# Patient Record
Sex: Female | Born: 1985 | Hispanic: Yes | Marital: Married | Smoking: Never smoker
Health system: Southern US, Community
[De-identification: ages and names within clinical notes are randomized; demographics above are authoritative.]

## PROBLEM LIST (undated history)

## (undated) DIAGNOSIS — N390 Urinary tract infection, site not specified: Secondary | ICD-10-CM

## (undated) DIAGNOSIS — N83209 Unspecified ovarian cyst, unspecified side: Secondary | ICD-10-CM

## (undated) DIAGNOSIS — N200 Calculus of kidney: Secondary | ICD-10-CM

## (undated) HISTORY — DX: Unspecified ovarian cyst, unspecified side: N83.209

## (undated) HISTORY — DX: Calculus of kidney: N20.0

## (undated) HISTORY — DX: Urinary tract infection, site not specified: N39.0

## (undated) HISTORY — PX: NO PAST SURGERIES: SHX2092

---

## 2021-02-27 LAB — OB RESULTS CONSOLE HIV ANTIBODY (ROUTINE TESTING): HIV: NONREACTIVE

## 2021-02-27 LAB — OB RESULTS CONSOLE ABO/RH: RH Type: POSITIVE

## 2021-02-27 LAB — OB RESULTS CONSOLE GC/CHLAMYDIA
Chlamydia: NEGATIVE
Gonorrhea: NEGATIVE

## 2021-02-27 LAB — OB RESULTS CONSOLE RUBELLA ANTIBODY, IGM: Rubella: IMMUNE

## 2021-02-27 LAB — OB RESULTS CONSOLE HEPATITIS B SURFACE ANTIGEN: Hepatitis B Surface Ag: NEGATIVE

## 2021-02-27 LAB — OB RESULTS CONSOLE RPR: RPR: NONREACTIVE

## 2021-02-27 LAB — OB RESULTS CONSOLE ANTIBODY SCREEN: Antibody Screen: NEGATIVE

## 2021-03-06 ENCOUNTER — Other Ambulatory Visit: Payer: Self-pay | Admitting: Family

## 2021-03-06 DIAGNOSIS — Z363 Encounter for antenatal screening for malformations: Secondary | ICD-10-CM

## 2021-03-06 DIAGNOSIS — O09522 Supervision of elderly multigravida, second trimester: Secondary | ICD-10-CM

## 2021-03-06 DIAGNOSIS — Z3A19 19 weeks gestation of pregnancy: Secondary | ICD-10-CM

## 2021-03-15 ENCOUNTER — Other Ambulatory Visit: Payer: Self-pay

## 2021-03-15 ENCOUNTER — Other Ambulatory Visit: Payer: Self-pay | Admitting: Obstetrics and Gynecology

## 2021-03-15 ENCOUNTER — Ambulatory Visit
Admission: RE | Admit: 2021-03-15 | Discharge: 2021-03-15 | Disposition: A | Discharge status: Home / Self Care | Payer: No Typology Code available for payment source | Source: Ambulatory Visit | Attending: Obstetrics and Gynecology | Admitting: Obstetrics and Gynecology

## 2021-03-15 DIAGNOSIS — Z111 Encounter for screening for respiratory tuberculosis: Secondary | ICD-10-CM

## 2021-03-28 ENCOUNTER — Encounter: Payer: Self-pay | Admitting: *Deleted

## 2021-03-30 ENCOUNTER — Other Ambulatory Visit: Payer: Self-pay | Admitting: *Deleted

## 2021-03-30 ENCOUNTER — Encounter: Payer: Self-pay | Admitting: *Deleted

## 2021-03-30 ENCOUNTER — Ambulatory Visit (HOSPITAL_BASED_OUTPATIENT_CLINIC_OR_DEPARTMENT_OTHER): Payer: Self-pay | Admitting: Genetic Counselor

## 2021-03-30 ENCOUNTER — Ambulatory Visit: Payer: Self-pay | Attending: Family

## 2021-03-30 ENCOUNTER — Ambulatory Visit: Payer: Self-pay | Admitting: Genetic Counselor

## 2021-03-30 ENCOUNTER — Other Ambulatory Visit: Payer: Self-pay

## 2021-03-30 ENCOUNTER — Ambulatory Visit: Payer: Self-pay | Admitting: *Deleted

## 2021-03-30 VITALS — BP 114/70 | HR 101 | Ht 63.0 in

## 2021-03-30 DIAGNOSIS — Z315 Encounter for genetic counseling: Secondary | ICD-10-CM | POA: Insufficient documentation

## 2021-03-30 DIAGNOSIS — Z3A19 19 weeks gestation of pregnancy: Secondary | ICD-10-CM | POA: Insufficient documentation

## 2021-03-30 DIAGNOSIS — Z363 Encounter for antenatal screening for malformations: Secondary | ICD-10-CM | POA: Insufficient documentation

## 2021-03-30 DIAGNOSIS — O09522 Supervision of elderly multigravida, second trimester: Secondary | ICD-10-CM

## 2021-03-30 DIAGNOSIS — O09529 Supervision of elderly multigravida, unspecified trimester: Secondary | ICD-10-CM

## 2021-03-30 NOTE — Progress Notes (Addendum)
ADDENDUM 04/14/21: I requested a copy of Linda Barker's quad screen results on 03/30/21 following her genetic counseling appointment. I received a fax from Linus Orn, RN at the [facility] Department informing me that the patient's quad screen results were not available. The [facility] Department will contact the patient once her quad screen results become available.  ----------------------------------------------------------------------------------------------------------------------  03/30/2021  Linda Barker 12-22-1986 MRN: [id number] DOV: 03/30/2021  [name] presented to the [facility] for a genetics consultation regarding advanced maternal age. [name] presented to her appointment alone. This session was facilitated by [facility] Spanish interpreter, Linda Barker.   Indication for genetic counseling - Advanced maternal age  Prenatal history  [name] is a [id number], 35 y.o. female. Her current pregnancy has completed [redacted]w[redacted]d (Estimated Date of Delivery: 08/01/21). [name] has a 48 year old son from a prior relationship. She and her current partner have a 67 year old daughter, a 47 year old daughter, and a 13 year old daughter together.  [name] denied exposure to environmental toxins or chemical agents. She denied the use of alcohol, tobacco or street drugs. She reported taking Tylenol. She denied significant viral illnesses, fevers, and bleeding during the course of her pregnancy. Her medical and surgical histories were noncontributory.  Family History  A three generation pedigree was drafted and reviewed. Both family histories were reviewed and found to be noncontributory for birth defects, intellectual disability, recurrent pregnancy loss, and known genetic conditions. [name] had limited information about her extended maternal relatives; thus, risk assessment  was limited.  The patient's ancestry is [place]. The father of the pregnancy's ancestry is [place]. Ashkenazi Jewish ancestry and consanguinity were denied. Pedigree will be scanned under Media.  Discussion  Advanced maternal age:  [name] was referred to genetic counseling for advanced maternal age, as she will be 35 years old at the time of delivery. We discussed that as a woman ages, the risk for certain chromosomal conditions, such as trisomy 62 (Down syndrome), trisomy 45, and trisomy 18 increases. These conditions often are not inherited, but instead occur due to an error in chromosomal division during the formation of sperm and egg cells in a process called nondisjunction. At her age and during the second trimester, [name] has approximately a 1 in 141 (0.7%) chance of having a child with a chromosomal aneuploidy. Her age-related risk to have a child with Down syndrome specifically is 1 in 310 (0.3%) in the second trimester. We briefly reviewed features associated with Down syndrome, trisomy 64, and trisomy 75.   [name] informed me that she had a sample drawn for quad screening on Monday at her OBGYN office. We discussed that quad screening measures four hormones in a pregnant woman's blood. The levels of these proteins can help identify if a pregnancy is at high risk for trisomy 36 (Down syndrome), trisomy 18, and open neural tube defects (ONTDs). Quad screening detects 75-80% of cases of Down syndrome, 73% of cases of trisomy 18, and 80-90% of ONTDs. Thus, it would not identify or rule out all fetal aneuploidy.   [name] was not sure if her results from quad screening are available yet. I will called the [facility] Department and request a copy of these results if they have been returned.  Ultrasound:  A complete ultrasound was performed today prior to our visit. The ultrasound report  will be sent under separate cover. There  were no visualized fetal anomalies or markers suggestive of aneuploidy. [name] was counseled that ~50% of fetuses with Down syndrome and 90-95% of fetuses with trisomy 14 or trisomy 27 demonstrate a sign of the respective conditions on anatomy ultrasound.  Diagnostic testing:  [name] was also counseled regarding diagnostic testing via amniocentesis. We discussed the technical aspects of the procedure and quoted up to a 1 in 500 (0.2%) risk for spontaneous pregnancy loss or other adverse pregnancy outcomes as a result of amniocentesis. Cultured cells from an amniocentesis sample allow for the visualization of a fetal karyotype, which can detect >99% of large chromosomal aberrations, including aneuploidies associated with advanced maternal age. Chromosomal microarray can also be performed to identify smaller deletions or duplications of fetal chromosomal material. [name] was counseled that amniocentesis is the only way to definitively determine if a fetus has a chromosomal aneuploidy prenatally. After careful consideration, [name] declined amniocentesis at this time. She understands that amniocentesis is available at any point after 16 weeks of pregnancy and that she may opt to undergo the procedure at a later date should she change her mind. She informed me that receiving a positive result on amniocentesis would not change how she manages her pregnancy and that she would prefer to pursue postnatal testing if indicated.  Plan:  [name] reportedly had a sample for quad screening early this week but was not sure if her results are back yet. We made a plan for me to call the [facility] Department to request a copy of these results if they are available. I will notify [name] of the results. If screening was not ordered and is desired, I can help facilitate sample collection and coordinate testing for [name].  I counseled [name] regarding the above risks and available options. The approximate face-to-face time with the genetic counselor was 30 minutes.  In summary:  Discussed advanced maternal age and options for follow-up testing  ~0.7% chance for fetus to have chromosomal aneuploidy  Sample drawn for quad screening with referring provider. I will contact them to request a copy of the results if available, then update the patient  Reviewed results of ultrasound  No fetal anomalies or markers seen  Reduction in risk for fetal aneuploidy  Offered additional testing and screening  Declined amniocentesis  Reviewed family history concerns   Gershon Crane, MS, Aeronautical engineer

## 2021-05-01 ENCOUNTER — Ambulatory Visit: Payer: Self-pay

## 2021-05-25 ENCOUNTER — Encounter: Payer: Self-pay | Admitting: *Deleted

## 2021-05-25 ENCOUNTER — Ambulatory Visit (HOSPITAL_BASED_OUTPATIENT_CLINIC_OR_DEPARTMENT_OTHER): Payer: Self-pay

## 2021-05-25 ENCOUNTER — Ambulatory Visit: Payer: Self-pay | Attending: Obstetrics | Admitting: *Deleted

## 2021-05-25 ENCOUNTER — Other Ambulatory Visit: Payer: Self-pay

## 2021-05-25 VITALS — BP 112/75 | HR 89

## 2021-05-25 DIAGNOSIS — O09293 Supervision of pregnancy with other poor reproductive or obstetric history, third trimester: Secondary | ICD-10-CM | POA: Insufficient documentation

## 2021-05-25 DIAGNOSIS — O0933 Supervision of pregnancy with insufficient antenatal care, third trimester: Secondary | ICD-10-CM | POA: Insufficient documentation

## 2021-05-25 DIAGNOSIS — Z3A30 30 weeks gestation of pregnancy: Secondary | ICD-10-CM | POA: Insufficient documentation

## 2021-05-25 DIAGNOSIS — O09523 Supervision of elderly multigravida, third trimester: Secondary | ICD-10-CM | POA: Insufficient documentation

## 2021-05-25 DIAGNOSIS — O09529 Supervision of elderly multigravida, unspecified trimester: Secondary | ICD-10-CM

## 2021-05-26 ENCOUNTER — Other Ambulatory Visit: Payer: Self-pay | Admitting: *Deleted

## 2021-05-26 ENCOUNTER — Telehealth: Payer: Self-pay

## 2021-05-26 DIAGNOSIS — O09523 Supervision of elderly multigravida, third trimester: Secondary | ICD-10-CM

## 2021-05-26 NOTE — Telephone Encounter (Signed)
Mailed patient her GFE with a est patient letter and map.  

## 2021-07-06 ENCOUNTER — Ambulatory Visit: Payer: Self-pay

## 2021-07-10 ENCOUNTER — Ambulatory Visit (HOSPITAL_BASED_OUTPATIENT_CLINIC_OR_DEPARTMENT_OTHER): Payer: Self-pay

## 2021-07-10 ENCOUNTER — Ambulatory Visit: Payer: Self-pay | Attending: Obstetrics and Gynecology | Admitting: *Deleted

## 2021-07-10 ENCOUNTER — Encounter: Payer: Self-pay | Admitting: *Deleted

## 2021-07-10 ENCOUNTER — Other Ambulatory Visit: Payer: Self-pay

## 2021-07-10 VITALS — BP 108/70 | HR 97

## 2021-07-10 DIAGNOSIS — O0933 Supervision of pregnancy with insufficient antenatal care, third trimester: Secondary | ICD-10-CM | POA: Insufficient documentation

## 2021-07-10 DIAGNOSIS — O09523 Supervision of elderly multigravida, third trimester: Secondary | ICD-10-CM | POA: Insufficient documentation

## 2021-07-10 DIAGNOSIS — O09293 Supervision of pregnancy with other poor reproductive or obstetric history, third trimester: Secondary | ICD-10-CM | POA: Insufficient documentation

## 2021-07-10 DIAGNOSIS — O3663X0 Maternal care for excessive fetal growth, third trimester, not applicable or unspecified: Secondary | ICD-10-CM | POA: Insufficient documentation

## 2021-07-10 DIAGNOSIS — Z3A36 36 weeks gestation of pregnancy: Secondary | ICD-10-CM | POA: Insufficient documentation

## 2021-07-10 NOTE — Progress Notes (Signed)
C/o"pain, burning in vagina-currently taking Valtrex."

## 2021-07-28 ENCOUNTER — Telehealth (HOSPITAL_COMMUNITY): Payer: Self-pay | Admitting: *Deleted

## 2021-07-28 ENCOUNTER — Other Ambulatory Visit: Payer: Self-pay | Admitting: Obstetrics & Gynecology

## 2021-07-28 ENCOUNTER — Encounter: Payer: Self-pay | Admitting: Family Medicine

## 2021-07-28 ENCOUNTER — Encounter (HOSPITAL_COMMUNITY): Payer: Self-pay

## 2021-07-28 ENCOUNTER — Inpatient Hospital Stay (HOSPITAL_COMMUNITY)
Admission: RE | Admit: 2021-07-28 | Discharge: 2021-07-28 | Disposition: A | Discharge status: Home / Self Care | Payer: Self-pay | Source: Ambulatory Visit | Attending: Family Medicine | Admitting: Family Medicine

## 2021-07-28 NOTE — Patient Instructions (Signed)
Instrucciones Para Antes de la Ciruga   Su ciruga est programada para 07/31/2021  (your procedure is scheduled on) Entre por la entrada principal del [facility]  a las 1230 de la [place] -(enter through the main entrance at [facility] at 1230 PM    [address] [place] [phone] para informarnos de su llegada. (pick up phone, dial [phone] on arrival)     Por favor llame al [phone] si tiene algn problema en la maana de la ciruga (please call this number if you have any problems the morning of surgery.)                  Recuerde: (Remember)  No coma alimentos. (Do not eat food (After Midnight) Desps de medianoche)    No tome lquidos claros. (Do not drink clear liquids (After Midnight) Desps de medianoche)    No use joyas, maquillaje de ojos, lpiz labial, crema para el cuerpo o esmalte de uas oscuro. (Do not wear jewelry, eye makeup, lipstick, body lotion, or dark fingernail polish). Puede usar desodorante (you may wear deodorant)    No se afeite 48 horas de su ciruga. (Do not shave 48 hours before your surgery)    No traiga objetos de valor al hospital.  [facility] no se hace responsable de ninguna pertenencia, ni objetos de valor que haya trado al hospital. (Do not bring valuable to the hospital.  [place] is not responsible for any belongings or valuables brought to the hospital)   [facility] medicinas en la maana de la ciruga con un SORBITO de agua take valtrex as prescribed (take these meds the morning of surgery with a SIP of water)     Durante la ciruga no se pueden usar lentes de contacto, dentaduras o puentes. (Contacts, dentures or bridgework cannot be worn in surgery).   Si va a ser ingresado despus de la ciruga, deje la [employer] en el carro hasta que se le haya asignado una habitacin. (If you are to be admitted after surgery, leave suitcase in car until your room has been assigned.)   A los  pacientes que se les d de alta el mismo da no se les permitir manejar a casa.  (Patients discharged on the day of surgery will not be allowed to drive home)    [place] de telf[employer] del [employer], production. (Name and telephone number of your driver)   Instrucciones especiales Shower using CHG 2 nights before surgery and the night before surgery.  If you shower the day of surgery use CHG.  Use special wash - you have one bottle of CHG for all showers.  You should use approximately 1/3 of the bottle for each shower. (Special Instructions)   Por favor, lea las hojas informativas que le entregaron. (Please read over the following fact sheets that you were given) Surgical Site Infection Prevention

## 2021-07-28 NOTE — Telephone Encounter (Signed)
Unable to reach patient or her husband with instructions.

## 2021-07-28 NOTE — Progress Notes (Signed)
Patient scheduled for cesarean section and tubal sterilization from [facility] given LGA fetus, prior shoulder dystocia. See [facility] notes for more details.  Jaynie Collins, MD

## 2021-07-28 NOTE — Pre-Procedure Instructions (Signed)
[phone] interpreter number

## 2021-07-28 NOTE — Pre-Procedure Instructions (Signed)
[phone] interpreter number

## 2021-07-31 ENCOUNTER — Inpatient Hospital Stay (HOSPITAL_COMMUNITY): Payer: Medicaid Other | Admitting: Anesthesiology

## 2021-07-31 ENCOUNTER — Encounter (HOSPITAL_COMMUNITY)
Admission: RE | Disposition: A | Discharge status: Home / Self Care | Payer: Self-pay | Source: Home / Self Care | Attending: Obstetrics and Gynecology

## 2021-07-31 ENCOUNTER — Encounter (HOSPITAL_COMMUNITY): Payer: Self-pay | Admitting: Family Medicine

## 2021-07-31 ENCOUNTER — Inpatient Hospital Stay (HOSPITAL_COMMUNITY)
Admission: RE | Admit: 2021-07-31 | Discharge: 2021-08-02 | DRG: 784 | Disposition: A | Discharge status: Home / Self Care | Payer: Medicaid Other | Attending: Obstetrics and Gynecology | Admitting: Obstetrics and Gynecology

## 2021-07-31 DIAGNOSIS — A6000 Herpesviral infection of urogenital system, unspecified: Secondary | ICD-10-CM | POA: Diagnosis present

## 2021-07-31 DIAGNOSIS — O9081 Anemia of the puerperium: Secondary | ICD-10-CM | POA: Diagnosis not present

## 2021-07-31 DIAGNOSIS — Z302 Encounter for sterilization: Secondary | ICD-10-CM

## 2021-07-31 DIAGNOSIS — O3663X0 Maternal care for excessive fetal growth, third trimester, not applicable or unspecified: Secondary | ICD-10-CM | POA: Diagnosis present

## 2021-07-31 DIAGNOSIS — O9832 Other infections with a predominantly sexual mode of transmission complicating childbirth: Secondary | ICD-10-CM | POA: Diagnosis present

## 2021-07-31 DIAGNOSIS — O34211 Maternal care for low transverse scar from previous cesarean delivery: Principal | ICD-10-CM | POA: Diagnosis present

## 2021-07-31 DIAGNOSIS — O99214 Obesity complicating childbirth: Secondary | ICD-10-CM | POA: Diagnosis present

## 2021-07-31 DIAGNOSIS — O09293 Supervision of pregnancy with other poor reproductive or obstetric history, third trimester: Secondary | ICD-10-CM

## 2021-07-31 DIAGNOSIS — Z20822 Contact with and (suspected) exposure to covid-19: Secondary | ICD-10-CM | POA: Diagnosis present

## 2021-07-31 DIAGNOSIS — D62 Acute posthemorrhagic anemia: Secondary | ICD-10-CM | POA: Diagnosis not present

## 2021-07-31 DIAGNOSIS — O0993 Supervision of high risk pregnancy, unspecified, third trimester: Secondary | ICD-10-CM

## 2021-07-31 DIAGNOSIS — Z3A39 39 weeks gestation of pregnancy: Secondary | ICD-10-CM

## 2021-07-31 LAB — RESP PANEL BY RT-PCR (FLU A&B, COVID) ARPGX2
Influenza A by PCR: NEGATIVE
Influenza B by PCR: NEGATIVE
SARS Coronavirus 2 by RT PCR: NEGATIVE

## 2021-07-31 LAB — CBC
HCT: 42.5 % (ref 36.0–46.0)
Hemoglobin: 14.3 g/dL (ref 12.0–15.0)
MCH: 29.4 pg (ref 26.0–34.0)
MCHC: 33.6 g/dL (ref 30.0–36.0)
MCV: 87.3 fL (ref 80.0–100.0)
Platelets: 199 10*3/uL (ref 150–400)
RBC: 4.87 MIL/uL (ref 3.87–5.11)
RDW: 13.6 % (ref 11.5–15.5)
WBC: 13.1 10*3/uL — ABNORMAL HIGH (ref 4.0–10.5)
nRBC: 0 % (ref 0.0–0.2)

## 2021-07-31 LAB — TYPE AND SCREEN
ABO/RH(D): B POS
Antibody Screen: NEGATIVE

## 2021-07-31 SURGERY — Surgical Case
Anesthesia: Spinal | Wound class: Clean Contaminated

## 2021-07-31 MED ORDER — NALBUPHINE HCL 10 MG/ML IJ SOLN: 5.0000 mg | INTRAMUSCULAR | Status: DC | PRN

## 2021-07-31 MED ORDER — ACETAMINOPHEN 500 MG PO TABS
ORAL_TABLET | ORAL | Status: AC
  Filled 2021-07-31: qty 2

## 2021-07-31 MED ORDER — KETOROLAC TROMETHAMINE 30 MG/ML IJ SOLN: 30.0000 mg | Freq: Four times a day (QID) | INTRAMUSCULAR | Status: DC | PRN

## 2021-07-31 MED ORDER — CEFAZOLIN SODIUM-DEXTROSE 2-4 GM/100ML-% IV SOLN
INTRAVENOUS | Status: AC
  Filled 2021-07-31: qty 100

## 2021-07-31 MED ORDER — EPHEDRINE SULFATE 50 MG/ML IJ SOLN
INTRAMUSCULAR | Status: DC | PRN
  Administered 2021-07-31: 20 mg via INTRAVENOUS

## 2021-07-31 MED ORDER — ONDANSETRON HCL 4 MG/2ML IJ SOLN: 4.0000 mg | Freq: Three times a day (TID) | INTRAMUSCULAR | Status: DC | PRN

## 2021-07-31 MED ORDER — COCONUT OIL OIL: 1.0000 "application " | TOPICAL_OIL | Status: DC | PRN

## 2021-07-31 MED ORDER — GABAPENTIN 300 MG PO CAPS
ORAL_CAPSULE | ORAL | Status: AC
  Filled 2021-07-31: qty 1

## 2021-07-31 MED ORDER — ATROPINE SULFATE 0.4 MG/ML IJ SOLN
INTRAMUSCULAR | Status: AC
  Filled 2021-07-31: qty 1

## 2021-07-31 MED ORDER — SIMETHICONE 80 MG PO CHEW
80.0000 mg | CHEWABLE_TABLET | Freq: Three times a day (TID) | ORAL | Status: DC
  Administered 2021-08-01 – 2021-08-02 (×4): 80 mg via ORAL
  Filled 2021-07-31 (×4): qty 1

## 2021-07-31 MED ORDER — PHENYLEPHRINE HCL-NACL 20-0.9 MG/250ML-% IV SOLN
INTRAVENOUS | Status: DC | PRN
  Administered 2021-07-31: 60 ug/min via INTRAVENOUS

## 2021-07-31 MED ORDER — SODIUM CHLORIDE 0.9 % IR SOLN
Status: DC | PRN
  Administered 2021-07-31: 1000 mL

## 2021-07-31 MED ORDER — KETOROLAC TROMETHAMINE 30 MG/ML IJ SOLN
30.0000 mg | Freq: Four times a day (QID) | INTRAMUSCULAR | Status: AC
  Administered 2021-08-01 (×3): 30 mg via INTRAVENOUS
  Filled 2021-07-31 (×3): qty 1

## 2021-07-31 MED ORDER — GABAPENTIN 300 MG PO CAPS
300.0000 mg | ORAL_CAPSULE | ORAL | Status: AC
  Administered 2021-07-31: 300 mg via ORAL

## 2021-07-31 MED ORDER — ALBUMIN HUMAN 5 % IV SOLN
INTRAVENOUS | Status: AC
  Filled 2021-07-31: qty 250

## 2021-07-31 MED ORDER — SODIUM CHLORIDE 0.9% FLUSH: 3.0000 mL | INTRAVENOUS | Status: DC | PRN

## 2021-07-31 MED ORDER — MEPERIDINE HCL 25 MG/ML IJ SOLN: 6.2500 mg | INTRAMUSCULAR | Status: DC | PRN

## 2021-07-31 MED ORDER — PHENYLEPHRINE HCL-NACL 20-0.9 MG/250ML-% IV SOLN
INTRAVENOUS | Status: AC
  Filled 2021-07-31: qty 250

## 2021-07-31 MED ORDER — FENTANYL CITRATE (PF) 100 MCG/2ML IJ SOLN
INTRAMUSCULAR | Status: DC | PRN
  Administered 2021-07-31: 15 ug via INTRATHECAL

## 2021-07-31 MED ORDER — ONDANSETRON HCL 4 MG/2ML IJ SOLN
INTRAMUSCULAR | Status: AC
  Filled 2021-07-31: qty 2

## 2021-07-31 MED ORDER — ONDANSETRON HCL 4 MG/2ML IJ SOLN
INTRAMUSCULAR | Status: DC | PRN
  Administered 2021-07-31: 4 mg via INTRAVENOUS

## 2021-07-31 MED ORDER — ACETAMINOPHEN 500 MG PO TABS
1000.0000 mg | ORAL_TABLET | ORAL | Status: AC
  Administered 2021-07-31: 1000 mg via ORAL

## 2021-07-31 MED ORDER — ALBUMIN HUMAN 5 % IV SOLN
INTRAVENOUS | Status: DC | PRN
Start: 2021-07-31 — End: 2021-07-31

## 2021-07-31 MED ORDER — CEFAZOLIN SODIUM-DEXTROSE 2-4 GM/100ML-% IV SOLN
2.0000 g | INTRAVENOUS | Status: AC
  Administered 2021-07-31: 2 g via INTRAVENOUS

## 2021-07-31 MED ORDER — NALBUPHINE HCL 10 MG/ML IJ SOLN: 5.0000 mg | Freq: Once | INTRAMUSCULAR | Status: DC | PRN

## 2021-07-31 MED ORDER — SCOPOLAMINE 1 MG/3DAYS TD PT72
MEDICATED_PATCH | TRANSDERMAL | Status: AC
  Filled 2021-07-31: qty 1

## 2021-07-31 MED ORDER — DIPHENHYDRAMINE HCL 25 MG PO CAPS: 25.0000 mg | ORAL_CAPSULE | Freq: Four times a day (QID) | ORAL | Status: DC | PRN

## 2021-07-31 MED ORDER — EPHEDRINE 5 MG/ML INJ
INTRAVENOUS | Status: AC
  Filled 2021-07-31: qty 5

## 2021-07-31 MED ORDER — ATROPINE SULFATE 0.4 MG/ML IJ SOLN
INTRAMUSCULAR | Status: DC | PRN
  Administered 2021-07-31: .4 mg via INTRAVENOUS

## 2021-07-31 MED ORDER — SCOPOLAMINE 1 MG/3DAYS TD PT72
1.0000 | MEDICATED_PATCH | TRANSDERMAL | Status: DC
  Administered 2021-07-31: 1.5 mg via TRANSDERMAL

## 2021-07-31 MED ORDER — ZOLPIDEM TARTRATE 5 MG PO TABS: 5.0000 mg | ORAL_TABLET | Freq: Every evening | ORAL | Status: DC | PRN

## 2021-07-31 MED ORDER — DEXTROSE 5 % IV SOLN
1.0000 ug/kg/h | INTRAVENOUS | Status: DC | PRN
  Filled 2021-07-31: qty 5

## 2021-07-31 MED ORDER — SENNOSIDES-DOCUSATE SODIUM 8.6-50 MG PO TABS
2.0000 | ORAL_TABLET | Freq: Every day | ORAL | Status: DC
  Administered 2021-08-01: 2 via ORAL
  Filled 2021-07-31: qty 2

## 2021-07-31 MED ORDER — LACTATED RINGERS IV SOLN: INTRAVENOUS | Status: DC

## 2021-07-31 MED ORDER — KETOROLAC TROMETHAMINE 30 MG/ML IJ SOLN
30.0000 mg | Freq: Four times a day (QID) | INTRAMUSCULAR | Status: DC | PRN
  Administered 2021-07-31: 30 mg via INTRAMUSCULAR

## 2021-07-31 MED ORDER — NALBUPHINE HCL 10 MG/ML IJ SOLN
5.0000 mg | Freq: Once | INTRAMUSCULAR | Status: DC | PRN
Start: 2021-07-31 — End: 2021-08-02

## 2021-07-31 MED ORDER — SIMETHICONE 80 MG PO CHEW: 80.0000 mg | CHEWABLE_TABLET | ORAL | Status: DC | PRN

## 2021-07-31 MED ORDER — OXYTOCIN-SODIUM CHLORIDE 30-0.9 UT/500ML-% IV SOLN
INTRAVENOUS | Status: DC | PRN
  Administered 2021-07-31: 30 [IU] via INTRAVENOUS

## 2021-07-31 MED ORDER — DIPHENHYDRAMINE HCL 50 MG/ML IJ SOLN: 12.5000 mg | INTRAMUSCULAR | Status: DC | PRN

## 2021-07-31 MED ORDER — POVIDONE-IODINE 10 % EX SWAB
2.0000 "application " | Freq: Once | CUTANEOUS | Status: AC
  Administered 2021-07-31: 2 via TOPICAL

## 2021-07-31 MED ORDER — ENOXAPARIN SODIUM 40 MG/0.4ML IJ SOSY
40.0000 mg | PREFILLED_SYRINGE | INTRAMUSCULAR | Status: DC
  Administered 2021-08-01: 40 mg via SUBCUTANEOUS
  Filled 2021-07-31: qty 0.4

## 2021-07-31 MED ORDER — FENTANYL CITRATE (PF) 100 MCG/2ML IJ SOLN
INTRAMUSCULAR | Status: AC
  Filled 2021-07-31: qty 2

## 2021-07-31 MED ORDER — MORPHINE SULFATE (PF) 0.5 MG/ML IJ SOLN
INTRAMUSCULAR | Status: AC
  Filled 2021-07-31: qty 10

## 2021-07-31 MED ORDER — SOD CITRATE-CITRIC ACID 500-334 MG/5ML PO SOLN
ORAL | Status: AC
  Filled 2021-07-31: qty 30

## 2021-07-31 MED ORDER — MENTHOL 3 MG MT LOZG: 1.0000 | LOZENGE | OROMUCOSAL | Status: DC | PRN

## 2021-07-31 MED ORDER — DIPHENHYDRAMINE HCL 25 MG PO CAPS: 25.0000 mg | ORAL_CAPSULE | ORAL | Status: DC | PRN

## 2021-07-31 MED ORDER — TETANUS-DIPHTH-ACELL PERTUSSIS 5-2.5-18.5 LF-MCG/0.5 IM SUSY: 0.5000 mL | PREFILLED_SYRINGE | Freq: Once | INTRAMUSCULAR | Status: DC

## 2021-07-31 MED ORDER — IBUPROFEN 600 MG PO TABS: 600.0000 mg | ORAL_TABLET | Freq: Four times a day (QID) | ORAL | Status: DC | PRN

## 2021-07-31 MED ORDER — STERILE WATER FOR IRRIGATION IR SOLN
Status: DC | PRN
  Administered 2021-07-31: 1000 mL

## 2021-07-31 MED ORDER — MEASLES, MUMPS & RUBELLA VAC IJ SOLR: 0.5000 mL | Freq: Once | INTRAMUSCULAR | Status: DC

## 2021-07-31 MED ORDER — KETOROLAC TROMETHAMINE 30 MG/ML IJ SOLN
INTRAMUSCULAR | Status: AC
  Filled 2021-07-31: qty 1

## 2021-07-31 MED ORDER — SOD CITRATE-CITRIC ACID 500-334 MG/5ML PO SOLN
30.0000 mL | ORAL | Status: AC
  Administered 2021-07-31: 30 mL via ORAL

## 2021-07-31 MED ORDER — OXYTOCIN-SODIUM CHLORIDE 30-0.9 UT/500ML-% IV SOLN
2.5000 [IU]/h | INTRAVENOUS | Status: AC
  Administered 2021-07-31: 2.5 [IU]/h via INTRAVENOUS
  Filled 2021-07-31: qty 500

## 2021-07-31 MED ORDER — BUPIVACAINE IN DEXTROSE 0.75-8.25 % IT SOLN
INTRATHECAL | Status: DC | PRN
Start: 2021-07-31 — End: 2021-07-31
  Administered 2021-07-31: 12.75 mg via INTRATHECAL

## 2021-07-31 MED ORDER — FENTANYL CITRATE (PF) 100 MCG/2ML IJ SOLN: 25.0000 ug | INTRAMUSCULAR | Status: DC | PRN

## 2021-07-31 MED ORDER — PRENATAL MULTIVITAMIN CH
1.0000 | ORAL_TABLET | Freq: Every day | ORAL | Status: DC
  Administered 2021-08-01: 1 via ORAL
  Filled 2021-07-31: qty 1

## 2021-07-31 MED ORDER — MORPHINE SULFATE (PF) 0.5 MG/ML IJ SOLN
INTRAMUSCULAR | Status: DC | PRN
  Administered 2021-07-31: 150 ug via INTRATHECAL

## 2021-07-31 MED ORDER — DIBUCAINE (PERIANAL) 1 % EX OINT: 1.0000 "application " | TOPICAL_OINTMENT | CUTANEOUS | Status: DC | PRN

## 2021-07-31 MED ORDER — WITCH HAZEL-GLYCERIN EX PADS: 1.0000 "application " | MEDICATED_PAD | CUTANEOUS | Status: DC | PRN

## 2021-07-31 MED ORDER — NALOXONE HCL 0.4 MG/ML IJ SOLN: 0.4000 mg | INTRAMUSCULAR | Status: DC | PRN

## 2021-07-31 SURGICAL SUPPLY — 37 items
BENZOIN TINCTURE PRP APPL 2/3 (GAUZE/BANDAGES/DRESSINGS) ×2 IMPLANT
CHLORAPREP W/TINT 26 (MISCELLANEOUS) ×4 IMPLANT
CHLORAPREP W/TINT 26ML (MISCELLANEOUS) ×2 IMPLANT
CLAMP CORD UMBIL (MISCELLANEOUS) IMPLANT
CLOSURE STERI STRIP 1/2 X4 (GAUZE/BANDAGES/DRESSINGS) ×2 IMPLANT
CLOTH BEACON ORANGE TIMEOUT ST (SAFETY) ×2 IMPLANT
DRSG OPSITE POSTOP 4X10 (GAUZE/BANDAGES/DRESSINGS) ×2 IMPLANT
ELECT REM PT RETURN 9FT ADLT (ELECTROSURGICAL) ×2
ELECTRODE REM PT RTRN 9FT ADLT (ELECTROSURGICAL) ×1 IMPLANT
EXTRACTOR VACUUM BELL STYLE (SUCTIONS) IMPLANT
GLOVE BIOGEL PI IND STRL 7.0 (GLOVE) ×2 IMPLANT
GLOVE BIOGEL PI INDICATOR 7.0 (GLOVE) ×2
GLOVE ECLIPSE 7.0 STRL STRAW (GLOVE) ×2 IMPLANT
GOWN STRL REUS W/TWL LRG LVL3 (GOWN DISPOSABLE) ×4 IMPLANT
KIT ABG SYR 3ML LUER SLIP (SYRINGE) ×2 IMPLANT
NEEDLE HYPO 25X5/8 SAFETYGLIDE (NEEDLE) ×2 IMPLANT
NS IRRIG 1000ML POUR BTL (IV SOLUTION) ×2 IMPLANT
PACK C SECTION WH (CUSTOM PROCEDURE TRAY) ×2 IMPLANT
PAD OB MATERNITY 4.3X12.25 (PERSONAL CARE ITEMS) ×2 IMPLANT
PENCIL SMOKE EVAC W/HOLSTER (ELECTROSURGICAL) ×2 IMPLANT
RETRACTOR TRAXI PANNICULUS (MISCELLANEOUS) ×1 IMPLANT
RTRCTR C-SECT PINK 25CM LRG (MISCELLANEOUS) ×2 IMPLANT
SUT MNCRL 0 VIOLET CTX 36 (SUTURE) ×2 IMPLANT
SUT MONOCRYL 0 CTX 36 (SUTURE) ×2
SUT PLAIN 0 NONE (SUTURE) ×2 IMPLANT
SUT PLAIN 2 0 (SUTURE)
SUT PLAIN 2 0 XLH (SUTURE) IMPLANT
SUT PLAIN ABS 2-0 CT1 27XMFL (SUTURE) IMPLANT
SUT VIC AB 0 CTX 36 (SUTURE) ×1
SUT VIC AB 0 CTX36XBRD ANBCTRL (SUTURE) ×1 IMPLANT
SUT VIC AB 2-0 CT1 27 (SUTURE) ×1
SUT VIC AB 2-0 CT1 TAPERPNT 27 (SUTURE) ×1 IMPLANT
SUT VIC AB 4-0 KS 27 (SUTURE) ×2 IMPLANT
TOWEL OR 17X24 6PK STRL BLUE (TOWEL DISPOSABLE) ×2 IMPLANT
TRAXI PANNICULUS RETRACTOR (MISCELLANEOUS) ×1
TRAY FOLEY W/BAG SLVR 14FR LF (SET/KITS/TRAYS/PACK) IMPLANT
WATER STERILE IRR 1000ML POUR (IV SOLUTION) ×2 IMPLANT

## 2021-07-31 NOTE — Anesthesia Preprocedure Evaluation (Addendum)
Anesthesia Evaluation  Patient identified by MRN, date of birth, ID band Patient awake    Reviewed: Allergy & Precautions, NPO status , Patient's Chart, lab work & pertinent test results  Airway Mallampati: III  TM Distance: >3 FB Neck ROM: Full    Dental no notable dental hx. (+) Teeth Intact, Dental Advisory Given   Pulmonary neg pulmonary ROS,    Pulmonary exam normal breath sounds clear to auscultation       Cardiovascular negative cardio ROS Normal cardiovascular exam Rhythm:Regular Rate:Normal     Neuro/Psych negative neurological ROS  negative psych ROS   GI/Hepatic Neg liver ROS, GERD  ,  Endo/Other    Renal/GU Renal diseaseHx/o nephrolithiasis  negative genitourinary   Musculoskeletal   Abdominal (+) + obese,   Peds  Hematology   Anesthesia Other Findings   Reproductive/Obstetrics (+) Pregnancy LGA Hx/o shoulder dystocia Desires sterilization                            Anesthesia Physical Anesthesia Plan  ASA: 2  Anesthesia Plan: Spinal   Post-op Pain Management:    Induction:   PONV Risk Score and Plan: 4 or greater and Treatment may vary due to age or medical condition, Scopolamine patch - Pre-op and Ondansetron  Airway Management Planned: Natural Airway  Additional Equipment:   Intra-op Plan:   Post-operative Plan:   Informed Consent: I have reviewed the patients History and Physical, chart, labs and discussed the procedure including the risks, benefits and alternatives for the proposed anesthesia with the patient or authorized representative who has indicated his/her understanding and acceptance.     Dental advisory given  Plan Discussed with: CRNA and Anesthesiologist  Anesthesia Plan Comments:        Anesthesia Quick Evaluation

## 2021-07-31 NOTE — Anesthesia Postprocedure Evaluation (Signed)
Anesthesia Post Note  Patient: Linda Barker  Procedure(s) Performed: CESAREAN SECTION WITH BILATERAL TUBAL LIGATION     Patient location during evaluation: Mother Baby Anesthesia Type: Spinal Level of consciousness: oriented and awake and alert Pain management: pain level controlled Vital Signs Assessment: post-procedure vital signs reviewed and stable Respiratory status: spontaneous breathing and respiratory function stable Cardiovascular status: blood pressure returned to baseline and stable Postop Assessment: no headache, no backache, no apparent nausea or vomiting and able to ambulate Anesthetic complications: no   No notable events documented.  Last Vitals:  Vitals:   07/31/21 2005 07/31/21 2015  BP:  (!) 99/59  Pulse: 68 90  Resp: 18 20  Temp: 36.6 C   SpO2: 100% 100%    Last Pain:  Vitals:   07/31/21 2005  TempSrc: Oral  PainSc: 0-No pain   Pain Goal:    LLE Motor Response: Purposeful movement (07/31/21 2005) LLE Sensation: Tingling (07/31/21 2005) RLE Motor Response: Purposeful movement (07/31/21 2005) RLE Sensation: Tingling (07/31/21 2005)     Epidural/Spinal Function Cutaneous sensation: Tingles (07/31/21 2005), Patient able to flex knees: Yes (07/31/21 2005), Patient able to lift hips off bed: No (07/31/21 2005), Back pain beyond tenderness at insertion site: No (07/31/21 2005), Progressively worsening motor and/or sensory loss: No (07/31/21 2005), Bowel and/or bladder incontinence post epidural: No (07/31/21 2005)  Trevor Iha

## 2021-07-31 NOTE — Anesthesia Procedure Notes (Signed)
Spinal  Patient location during procedure: OB Start time: 07/31/2021 5:50 PM End time: 07/31/2021 5:55 PM Reason for block: surgical anesthesia Staffing Performed: anesthesiologist  Anesthesiologist: Trevor Iha, MD Preanesthetic Checklist Completed: patient identified, IV checked, risks and benefits discussed, surgical consent, monitors and equipment checked, pre-op evaluation and timeout performed Spinal Block Patient position: sitting Prep: DuraPrep and site prepped and draped Patient monitoring: heart rate, cardiac monitor, continuous pulse ox and blood pressure Approach: midline Location: L3-4 Injection technique: single-shot Needle Needle type: Pencan  Needle gauge: 24 G Needle length: 10 cm Needle insertion depth: 7 cm Assessment Sensory level: T4 Events: CSF return Additional Notes  2 Attempt (s). Pt tolerated procedure well.

## 2021-07-31 NOTE — Discharge Summary (Addendum)
Postpartum Discharge Summary  Date of Service updated     Patient Name: Linda Barker DOB: November 28, 1986 MRN: [id number]  Date of admission: 07/31/2021 Delivery date:07/31/2021  Delivering provider: Chancy Milroy  Date of discharge: 08/02/2021  Admitting diagnosis: H/O shoulder dystocia in prior pregnancy, currently pregnant, third trimester [O09.293] Encounter for supervision of high risk pregnancy in third trimester, antepartum [O09.93] Intrauterine pregnancy: [redacted]w[redacted]d    Secondary diagnosis:  Active Problems:   H/O shoulder dystocia in prior pregnancy, currently pregnant, third trimester   Cesarean delivery delivered   Encounter for supervision of high risk pregnancy in third trimester, antepartum   Acute blood loss anemia  Additional problems: None    Discharge diagnosis: Term Pregnancy Delivered                                              Post partum procedures: IV Venofer administration Complications: None  Hospital course: Sceduled C/S   35y.o. yo G5P5005 at 360w6das admitted to the hospital 07/31/2021 for scheduled cesarean section with the following indication:Macrosomia.Delivery details are as follows:  Membrane Rupture Time/Date: 6:24 PM ,07/31/2021   Delivery Method:C-Section, Forcep Assisted  Details of operation can be found in separate operative note.  Patient had an uncomplicated postpartum course.  She is ambulating, tolerating a regular diet, passing flatus, and urinating well. Patient is discharged home in stable condition on  08/02/21        Newborn Data: Birth date:07/31/2021  Birth time:6:25 PM  Gender:Female  Living status:Living  Apgars:9 ,9  Weight:3920 g     Magnesium Sulfate received: No BMZ received: No Rhophylac:No MMR:N/A T-DaP:Given prenatally Flu: N/A Transfusion:No  Physical exam  Vitals:   08/01/21 1043 08/01/21 1350 08/01/21 2148 08/02/21 0602  BP: 104/62 90/75 93/61  107/70  Pulse: 74 85 82 88  Resp: 18 18 18 18   Temp: 98  F (36.7 C) 98.9 F (37.2 C) 98.9 F (37.2 C) 98.9 F (37.2 C)  TempSrc: Oral Oral Oral Oral  SpO2: 100% 100%    Weight:      Height:       General: alert, cooperative, and no distress Lochia: appropriate Uterine Fundus: firm Incision: Healing well with no significant drainage, No significant erythema, Dressing is clean, dry, and intact DVT Evaluation: No evidence of DVT seen on physical exam. Labs: Lab Results  Component Value Date   WBC 11.6 (H) 08/02/2021   HGB 8.8 (L) 08/02/2021   HCT 26.3 (L) 08/02/2021   MCV 89.2 08/02/2021   PLT 154 08/02/2021   No flowsheet data found. Edinburgh Score: Edinburgh Postnatal Depression Scale Screening Tool 08/02/2021  I have been able to laugh and see the funny side of things. (No Data)     After visit meds:  Allergies as of 08/02/2021   No Known Allergies      Medication List     STOP taking these medications    cephALEXin 500 MG capsule Commonly known as: KEFLEX   valACYclovir 1000 MG tablet Commonly known as: VALTREX       TAKE these medications    acetaminophen 500 MG tablet Commonly known as: TYLENOL Take 2 tablets (1,000 mg total) by mouth every 6 (six) hours.   diphenhydrAMINE 25 mg capsule Commonly known as: BENADRYL Take 1 capsule (25 mg total) by mouth every 6 (six) hours as needed  for itching.   ibuprofen 600 MG tablet Commonly known as: ADVIL Take 1 tablet (600 mg total) by mouth every 6 (six) hours as needed (pain).   oxyCODONE 5 MG immediate release tablet Commonly known as: Oxy IR/ROXICODONE Take 1 tablet (5 mg total) by mouth every 4 (four) hours as needed for moderate pain or severe pain.   prenatal multivitamin Tabs tablet Take 1 tablet by mouth daily at 12 noon.   simethicone 80 MG chewable tablet Commonly known as: MYLICON Chew 1 tablet (80 mg total) by mouth as needed for flatulence.         Discharge home in stable condition Infant Feeding: Breast Infant Disposition:home with  mother Discharge instruction: per After Visit Summary and Postpartum booklet. Activity: Advance as tolerated. Pelvic rest for 6 weeks.  Diet: routine diet Future Appointments:No future appointments. Follow up Visit: [facility] patient, patient to call to schedule appointment  Please schedule this patient for a In person postpartum visit in 4 weeks with the following provider: Any provider. Additional Postpartum F/U:Incision check 1 week  Low risk pregnancy complicated by:  None Delivery mode:  C-Section, Forcep Assisted  Anticipated Birth Control:  BTL done [facility]   Midwife attestation I have seen and examined this patient and agree with above documentation in the resident's note.   Linda Barker is a 35 y.o. [id number] s/p CS.  Pain is well controlled. Plan for birth control is bilateral tubal ligation. Method of Feeding: breast  PE:  Gen: well appearing Heart: reg rate Lungs: normal WOB Fundus firm Ext: no pain, no edema  Recent Labs    08/01/21 0409 08/02/21 0551  HGB 9.3* 8.8*  HCT 27.2* 26.3*    Assessment S/p CS PPD # 2  Plan: - discharge home - postpartum care discussed - f/u in office in 6 weeks for postpartum visit - f/u in office in 1 week for incision check  Interpreter present for all interactions  Julianne Handler, CNM 11:08 AM

## 2021-07-31 NOTE — H&P (Signed)
OBSTETRIC ADMISSION HISTORY AND PHYSICAL  Linda Barker is a 35 y.o. female [phone] with IUP at [redacted]w[redacted]d by [place]  presenting for pLTCS for history of shoulder dystocia in prior delivery. She reports +FMs, No LOF, no VB, no blurry vision, headaches or peripheral edema, and RUQ pain.  She plans on breast  feeding. She request BTL for birth control. She received her prenatal care at [facility]   Dating: By [place] --->  Estimated Date of Delivery: 08/01/21  Sono:   @[redacted]w[redacted]d , CWD, normal anatomy, cephalic presentation, posterior lie, 3518g, 91% EFW   Prenatal History/Complications:  Obesity Hx of HSV    Past Medical History: Past Medical History:  Diagnosis Date   Kidney stones    Ovarian cyst    UTI (urinary tract infection)     Past Surgical History: Past Surgical History:  Procedure Laterality Date   NO PAST SURGERIES      Obstetrical History: OB History     Gravida  5   Para  4   Term  4   Preterm      AB      Living  4      SAB      IAB      Ectopic      Multiple      Live Births              Social History Social History   Socioeconomic History   Marital status: Married    Spouse name: Not on file   Number of children: Not on file   Years of education: Not on file   Highest education level: Not on file  Occupational History   Not on file  Tobacco Use   Smoking status: Never   Smokeless tobacco: Never  Vaping Use   Vaping Use: Never used  Substance and Sexual Activity   Alcohol use: Never   Drug use: Never   Sexual activity: Yes  Other Topics Concern   Not on file  Social History Narrative   Not on file   Social Determinants of Health   Financial Resource Strain: Not on file  Food Insecurity: Not on file  Transportation Needs: Not on file  Physical Activity: Not on file  Stress: Not on file  Social Connections: Not on file    Family History: Family History  Problem Relation Age of Onset   Diabetes Mother      Allergies: No Known Allergies  Medications Prior to Admission  Medication Sig Dispense Refill Last Dose   cephALEXin (KEFLEX) 500 MG capsule Take 500 mg by mouth at bedtime.      Prenatal Vit-Fe Fumarate-FA (PRENATAL MULTIVITAMIN) TABS tablet Take 1 tablet by mouth daily at 12 noon.      valACYclovir (VALTREX) 1000 MG tablet Take 500 mg by mouth 2 (two) times daily.        Review of Systems   All systems reviewed and negative except as stated in HPI  Blood pressure (!) 141/95, pulse (!) 109, temperature 98.8 F (37.1 C), temperature source Oral, resp. rate 18, height 5\' 3"  (1.6 m), weight 88.5 kg, last menstrual period 11/17/2020. General appearance: alert Lungs: clear to auscultation bilaterally Heart: regular rate and rhythm Abdomen: soft, non-tender; bowel sounds normal Extremities: Homans sign is negative, no sign of DVT  Prenatal labs: ABO, Rh: --/--/B POS (08/08 1415) Antibody: NEG (08/08 1415) Rubella: Immune (03/07 0000) RPR: Nonreactive (03/07 0000)  HBsAg: Negative (03/07 0000)  HIV: Non-reactive (03/07 0000)  GBS: Negative   1 hr Glucola 106 Genetic screening  Quad screen negative Anatomy US 91%ile  Prenatal Transfer Tool  Maternal Diabetes: No Genetic Screening: Normal Maternal Ultrasounds/Referrals: Normal Fetal Ultrasounds or other Referrals:  None Maternal Substance Abuse:  No Significant Maternal Medications:  None Significant Maternal Lab Results: Group B Strep negative  Results for orders placed or performed during the hospital encounter of 07/31/21 (from the past 24 hour(s))  Resp Panel by RT-PCR (Flu A&B, Covid) Nasopharyngeal Swab   Collection Time: 07/31/21  2:00 PM   Specimen: Nasopharyngeal Swab; Nasopharyngeal(NP) swabs in vial transport medium  Result Value Ref Range   SARS Coronavirus 2 by RT PCR NEGATIVE NEGATIVE   Influenza A by PCR NEGATIVE NEGATIVE   Influenza B by PCR NEGATIVE NEGATIVE  CBC   Collection Time: 07/31/21  2:15 PM   Result Value Ref Range   WBC 13.1 (H) 4.0 - 10.5 K/uL   RBC 4.87 3.87 - 5.11 MIL/uL   Hemoglobin 14.3 12.0 - 15.0 g/dL   HCT 72.5 36.6 - 44.0 %   MCV 87.3 80.0 - 100.0 fL   MCH 29.4 26.0 - 34.0 pg   MCHC 33.6 30.0 - 36.0 g/dL   RDW 34.7 42.5 - 95.6 %   Platelets 199 150 - 400 K/uL   nRBC 0.0 0.0 - 0.2 %  Type and screen   Collection Time: 07/31/21  2:15 PM  Result Value Ref Range   ABO/RH(D) B POS    Antibody Screen NEG    Sample Expiration      08/03/2021,2359 Performed at [facility], [address], [place], [place] [address]     Patient Active Problem List   Diagnosis Date Noted   H/O shoulder dystocia in prior pregnancy, currently pregnant, third trimester 07/31/2021    Assessment/Plan:  Linda Barker is a 35 y.o. [id number] at [redacted]w[redacted]d here for scheduled pLTCS for history of shoulder dystocia pLTCS for hx of shoulder dystocia The risks of cesarean section were discussed with the patient including but were not limited to: bleeding which may require transfusion or reoperation; infection which may require antibiotics; injury to bowel, bladder, ureters or other surrounding organs; injury to the fetus; need for additional procedures including hysterectomy in the event of a life-threatening hemorrhage; placental abnormalities wth subsequent pregnancies, incisional problems, thromboembolic phenomenon and other postoperative/anesthesia complications. Patient also desires permanent sterilization.  Other reversible forms of contraception were discussed with patient; she declines all other modalities. Risks of procedure discussed with patient including but not limited to: risk of regret, permanence of method, bleeding, infection, injury to surrounding organs and need for additional procedures.  Failure risk of about 1% with increased risk of ectopic gestation if pregnancy occurs was also discussed with patient.  Also discussed possibility of post-tubal pain syndrome.The patient  concurred with the proposed plan, giving informed written consent for the procedures.  Patient has been NPO since 12PM she will remain NPO for procedure. Anesthesia and OR aware.  Preoperative prophylactic antibiotics and SCDs ordered on call to the OR.  To OR when ready.    #Pain: Spinal #ID:  GBS negative #MOF: breast #MOC:BTL  #Circ:  N/A female  Warner Mccreedy, MD, MPH OB Fellow, Faculty Practice

## 2021-07-31 NOTE — Op Note (Addendum)
Cesarean Section and bilateral salpingectomy Procedure Note  07/31/2021  7:33 PM  PATIENT:  Linda Barker  35 y.o. female  PRE-OPERATIVE DIAGNOSIS:  LGA h/o of prior shoulder dystocia  POST-OPERATIVE DIAGNOSIS:  LGA h/o of prior shoulder dystocia  PROCEDURE:  Procedure(s): CESAREAN SECTION WITH BILATERAL TUBAL LIGATION (N/A)  SURGEON:  Surgeon(s) and Role:    * Hermina Staggers, MD - Primary  ASSISTANTS: Warner Mccreedy, MD   ANESTHESIA:   spinal  EBL:  Total I/O In: 250 [IV Piggyback:250] Out: 1004 [Blood:1004]  BLOOD ADMINISTERED:none  DRAINS: none   LOCAL MEDICATIONS USED:  NONE  SPECIMEN:  Source of Specimen:  right and left fallopian tube  DISPOSITION OF SPECIMEN:  PATHOLOGY   Procedure Details   The patient was seen in the Holding Room. The risks, benefits, complications, treatment options, and expected outcomes were discussed with the patient.  The patient concurred with the proposed plan, giving informed consent.  The site of surgery properly noted/marked. The patient was taken to Operating Room # B, identified as Linda Barker and the procedure verified as C-Section Delivery. A Time Out was held and the above information confirmed.  After induction of anesthesia, the patient was draped and prepped in the usual sterile manner. A Pfannenstiel incision was made and carried down through the subcutaneous tissue to the fascia. Fascial incision was made and extended transversely. The fascia was separated from the underlying rectus tissue superiorly and inferiorly. The peritoneum was identified and entered. Peritoneal incision was extended longitudinally. The utero-vesical peritoneal reflection was incised transversely and the bladder flap was bluntly freed from the lower uterine segment. A low transverse uterine incision was made. Delivered from cephalic presentation was an Female with Apgar scores of 9 at one minute and 9 at five minutes. After the umbilical  cord was clamped and cut cord blood was obtained for evaluation. The placenta was removed intact and appeared normal. The uterine outline, tubes and ovaries appeared normal.  The uterine incision was closed with running locked sutures of Vicryl. Hemostasis was observed.   Attention was then turned to the fallopian tubes. Bilateral salpingectomy: A Kelly clamp was placed across the left fallopian tube taking care to incorporate the fimbriae. A second clamp was then placed above the first. The fallopian tube was then removed with Metzenbaum scissors. The pedicle was then ligated with  a free tie suture times  two and the second clamp was removed with excellent hemostasis noted.  The same procedure was then carried out on the right fallopian tube with excellent hemostasis noted.  Lavage was carried out until clear. The peritoneum was reapproximated. The fascia was then closed using 0 Vicryl in a running fashion. The subcutaneous layer was reapproximated with plain gut and the skin was closed with a 4-0 vicryl subcuticular stitch. The patient tolerated the procedure well. Sponge, lap, instrument and needle counts were correct x 2. She was taken to the recovery room in stable condition.   Complications:  None; patient tolerated the procedure well.   PLAN OF CARE: postpartum  PATIENT DISPOSITION:  PACU - hemodynamically stable.   Delay start of Pharmacological VTE agent (>24hrs) due to surgical blood loss or risk of bleeding: no             Disposition: PACU - hemodynamically stable.         Condition: stable  Warner Mccreedy, MD, MPH OB Fellow, Faculty Practice    Attestation of Attending Supervision of Obstetric Fellow During Surgery: Surgery  was performed with the Obstetric Fellow under my supervision and collaboration.  I was present and scrubbed for the entire procedure.   I have reviewed the Obstetric Fellow's operative report, and I agree with the documentation.  Nettie Elm, MD,  FACOG Attending Obstetrician & Gynecologist Faculty Practice, [facility]

## 2021-07-31 NOTE — Transfer of Care (Signed)
Immediate Anesthesia Transfer of Care Note  Patient: Linda Barker  Procedure(s) Performed: CESAREAN SECTION WITH BILATERAL TUBAL LIGATION  Patient Location: PACU  Anesthesia Type:Spinal  Level of Consciousness: awake, alert  and patient cooperative  Airway & Oxygen Therapy: Patient Spontanous Breathing  Post-op Assessment: Report given to RN and Post -op Vital signs reviewed and stable  Post vital signs: Reviewed and stable  Last Vitals:  Vitals Value Taken Time  BP 97/62 07/31/21 1932  Temp 36.7 C 07/31/21 1933  Pulse 82 07/31/21 1937  Resp 16 07/31/21 1937  SpO2 100 % 07/31/21 1937  Vitals shown include unvalidated device data.  Last Pain:  Vitals:   07/31/21 1933  TempSrc: Oral  PainSc:          Complications: No notable events documented.

## 2021-08-01 ENCOUNTER — Encounter (HOSPITAL_COMMUNITY): Payer: Self-pay | Admitting: Obstetrics and Gynecology

## 2021-08-01 LAB — CBC
HCT: 27.2 % — ABNORMAL LOW (ref 36.0–46.0)
Hemoglobin: 9.3 g/dL — ABNORMAL LOW (ref 12.0–15.0)
MCH: 30.1 pg (ref 26.0–34.0)
MCHC: 34.2 g/dL (ref 30.0–36.0)
MCV: 88 fL (ref 80.0–100.0)
Platelets: 145 10*3/uL — ABNORMAL LOW (ref 150–400)
RBC: 3.09 MIL/uL — ABNORMAL LOW (ref 3.87–5.11)
RDW: 13.5 % (ref 11.5–15.5)
WBC: 10.9 10*3/uL — ABNORMAL HIGH (ref 4.0–10.5)
nRBC: 0 % (ref 0.0–0.2)

## 2021-08-01 LAB — RPR: RPR Ser Ql: NONREACTIVE

## 2021-08-01 MED ORDER — OXYCODONE HCL 5 MG PO TABS
5.0000 mg | ORAL_TABLET | ORAL | Status: DC | PRN
  Administered 2021-08-01: 5 mg via ORAL
  Filled 2021-08-01: qty 1

## 2021-08-01 MED ORDER — SODIUM CHLORIDE 0.9 % IV SOLN
500.0000 mg | Freq: Once | INTRAVENOUS | Status: AC
  Administered 2021-08-01: 500 mg via INTRAVENOUS
  Filled 2021-08-01: qty 25

## 2021-08-01 MED ORDER — ACETAMINOPHEN 500 MG PO TABS
1000.0000 mg | ORAL_TABLET | Freq: Four times a day (QID) | ORAL | Status: DC
  Administered 2021-08-01 – 2021-08-02 (×4): 1000 mg via ORAL
  Filled 2021-08-01 (×4): qty 2

## 2021-08-01 NOTE — Progress Notes (Signed)
Post Operative Day 1 Subjective: [name] is feeling well today with very little pain. She has been able to ambulate independently but has felt dizzy when getting up. She still has a foley. She has passed flatus but has not had a bowel movement. Her lochia is appropriate.   Objective: Blood pressure 101/61, pulse 76, temperature 97.8 F (36.6 C), temperature source Oral, resp. rate 18, height 5\' 3"  (1.6 m), weight 88.5 kg, last menstrual period 11/17/2020, SpO2 100 %, unknown if currently breastfeeding.  Physical Exam:  General: alert and no distress Lochia: appropriate Uterine Fundus: firm Incision: Dressing intact, small amount of bleeding observed on the left side of the bandage  DVT Evaluation: No cords or calf tenderness. No significant calf/ankle edema.  Recent Labs    07/31/21 1415 08/01/21 0409  HGB 14.3 9.3*  HCT 42.5 27.2*    Assessment/Plan: POD #1 - VSS  - Meeting all PP milestones  - Needs to have foley removed  - Breastfeeding  - BTL   Anemia  - Hgb drop from 14.3 to 9.3  - Patient dizzy, will give IV Venofer   Disposition: Patient will likely be discharged home tomorrow.    LOS: 1 day   10/01/21 08/01/2021, 7:51 AM

## 2021-08-02 DIAGNOSIS — D62 Acute posthemorrhagic anemia: Secondary | ICD-10-CM

## 2021-08-02 LAB — CBC
HCT: 26.3 % — ABNORMAL LOW (ref 36.0–46.0)
Hemoglobin: 8.8 g/dL — ABNORMAL LOW (ref 12.0–15.0)
MCH: 29.8 pg (ref 26.0–34.0)
MCHC: 33.5 g/dL (ref 30.0–36.0)
MCV: 89.2 fL (ref 80.0–100.0)
Platelets: 154 10*3/uL (ref 150–400)
RBC: 2.95 MIL/uL — ABNORMAL LOW (ref 3.87–5.11)
RDW: 14 % (ref 11.5–15.5)
WBC: 11.6 10*3/uL — ABNORMAL HIGH (ref 4.0–10.5)
nRBC: 0 % (ref 0.0–0.2)

## 2021-08-02 LAB — SURGICAL PATHOLOGY

## 2021-08-02 MED ORDER — DIPHENHYDRAMINE HCL 25 MG PO CAPS: 25.0000 mg | ORAL_CAPSULE | Freq: Four times a day (QID) | ORAL | 0 refills | Status: DC | PRN

## 2021-08-02 MED ORDER — IBUPROFEN 600 MG PO TABS: 600.0000 mg | ORAL_TABLET | Freq: Four times a day (QID) | ORAL | 0 refills | Status: DC | PRN

## 2021-08-02 MED ORDER — SIMETHICONE 80 MG PO CHEW: 80.0000 mg | CHEWABLE_TABLET | ORAL | 0 refills | Status: AC | PRN

## 2021-08-02 MED ORDER — ACETAMINOPHEN 500 MG PO TABS: 1000.0000 mg | ORAL_TABLET | Freq: Four times a day (QID) | ORAL | 0 refills | Status: AC

## 2021-08-02 MED ORDER — OXYCODONE HCL 5 MG PO TABS: 5.0000 mg | ORAL_TABLET | ORAL | 0 refills | Status: AC | PRN

## 2021-08-09 ENCOUNTER — Telehealth: Payer: Self-pay

## 2021-08-09 NOTE — Telephone Encounter (Signed)
Jacquelin Hawking [facility] Nurse left voicemail on nurse line stating patient has not been scheduled for incision heck post c-section on 07/31/21. Appt scheduled for tomorrow, 08/10/21 at 0920. Called both numbers on patients chart with interpreter Eda; VM not set up at either number. Vista Lawman; VM left stating pt has appt schedule (date and time given) and I have been unable to contact patient with information. Will attempt to reach patient once more today.

## 2021-08-10 ENCOUNTER — Other Ambulatory Visit: Payer: Self-pay

## 2021-08-10 ENCOUNTER — Ambulatory Visit (INDEPENDENT_AMBULATORY_CARE_PROVIDER_SITE_OTHER): Payer: Self-pay

## 2021-08-10 DIAGNOSIS — R300 Dysuria: Secondary | ICD-10-CM

## 2021-08-10 LAB — POCT URINALYSIS DIP (DEVICE)
Bilirubin Urine: NEGATIVE
Glucose, UA: NEGATIVE mg/dL
Ketones, ur: NEGATIVE mg/dL
Nitrite: NEGATIVE
Protein, ur: NEGATIVE mg/dL
Specific Gravity, Urine: 1.02 (ref 1.005–1.030)
Urobilinogen, UA: 0.2 mg/dL (ref 0.0–1.0)
pH: 5.5 (ref 5.0–8.0)

## 2021-08-10 MED ORDER — NITROFURANTOIN MONOHYD MACRO 100 MG PO CAPS: 100.0000 mg | ORAL_CAPSULE | Freq: Two times a day (BID) | ORAL | 0 refills | Status: AC

## 2021-08-10 NOTE — Progress Notes (Signed)
Pt here today for wound check. Pt had C-section on 07/31/2021.Marland Kitchen  Pt denies any drainage, redness or fever to site.   Incision today is clean, dry, intact and well approximated. Pt had honeycomb dressing and Steri-strips that were removed today. Pt tolerated well.   Pt advised to keep clean and dry. Pt verbalized understanding.   Pt has scheduled PP visit at [facility]. Pt aware and agreeable to call for appt.   Pt also having UTI sx. Pt states having burning, pain and frequency since delivery. UA was performed in office today. Large blood and small Leuks was seen. Pt treated today with Macrobid Rx per protocol. Pt also advised to increase fluids, use Ibuprofen or Tylenol for comfort. Pt verbalized understanding and agreeable to plan of care. Rx sent to pharmacy on file.   Judeth Cornfield, RN

## 2021-08-10 NOTE — Telephone Encounter (Signed)
Unable to call patient a second time; however, pt did keep appt this AM.

## 2021-08-11 ENCOUNTER — Telehealth (HOSPITAL_COMMUNITY): Payer: Self-pay | Admitting: *Deleted

## 2021-08-11 NOTE — Progress Notes (Signed)
Patient was assessed and managed by nursing staff during this encounter. I have reviewed the chart and agree with the documentation and plan. I have also made any necessary editorial changes.  Warden Fillers, MD 08/11/2021 3:16 PM

## 2021-08-11 NOTE — Telephone Encounter (Signed)
Voice mailbox not set up, no message left.  Duffy Rhody, RN 08-11-2021 at 3:14pm

## 2022-03-15 IMAGING — US US MFM OB FOLLOW-UP
1 series · 13 of 28 positions shown · non-contrast
Comparison: none

[Series 1: us mfm ob follow-up · 45 acquisitions, 13 frames shown]
[im 2/45]
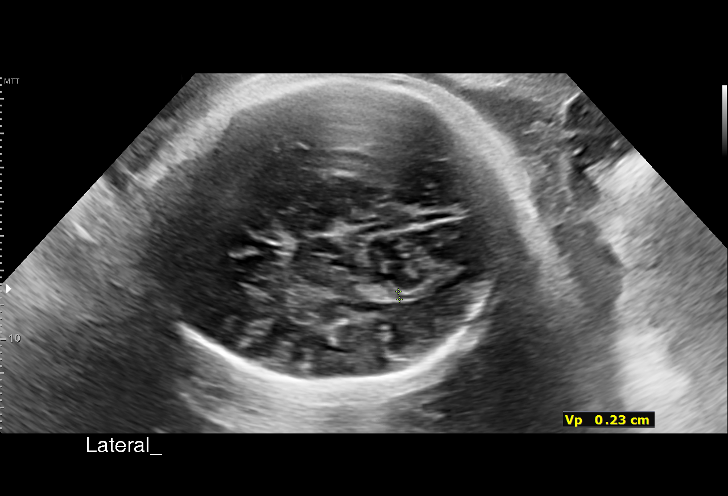
[im 5/45]
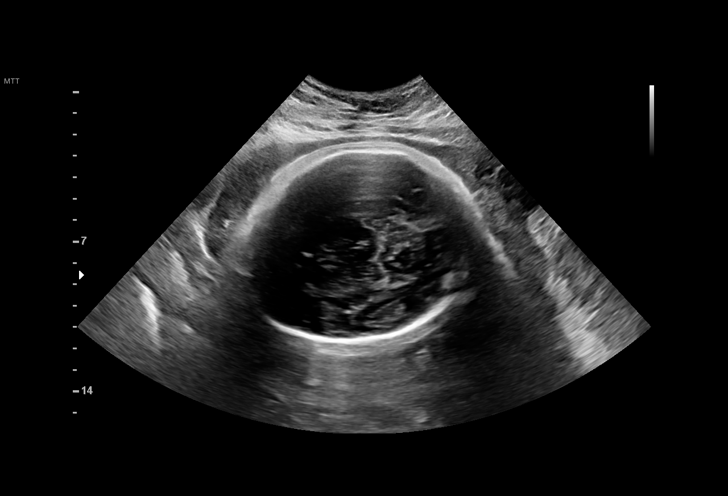
[im 9/45]
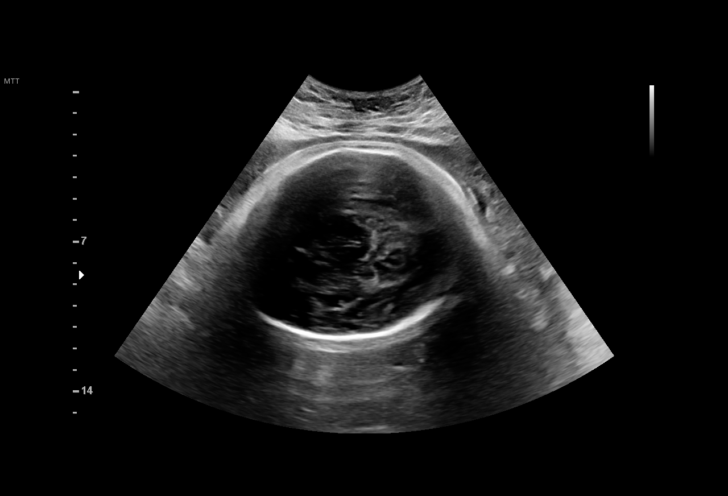
[im 12/45]
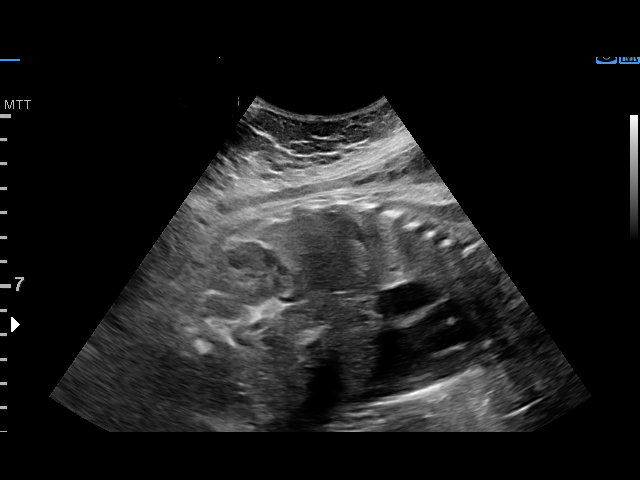
[im 15/45]
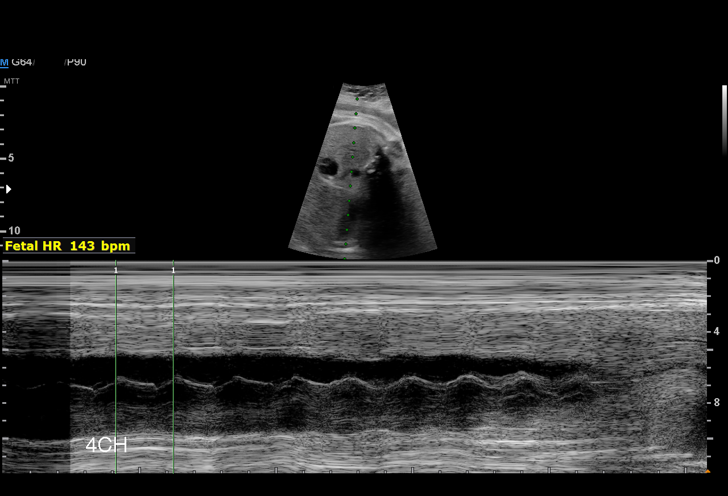
[im 18/45]
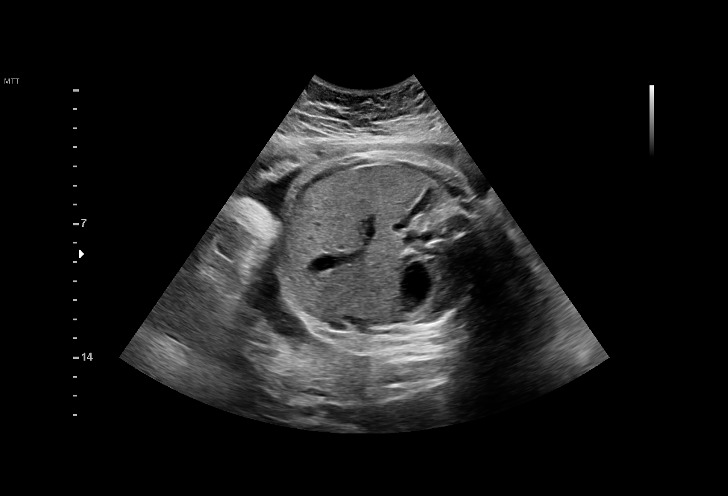
[im 23/45]
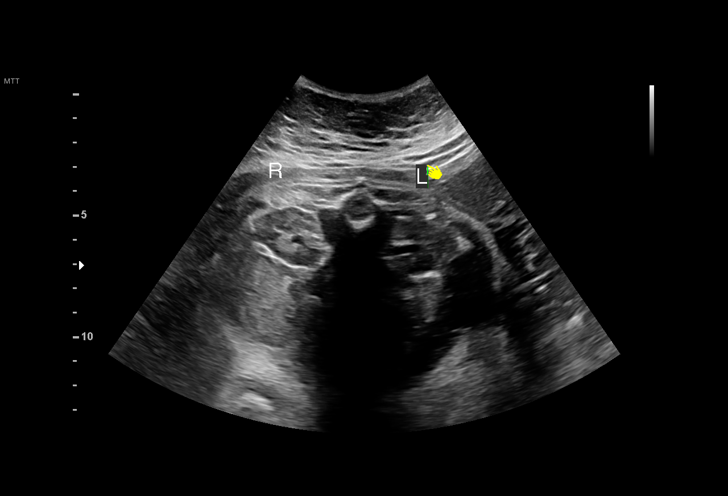
[im 27/45]
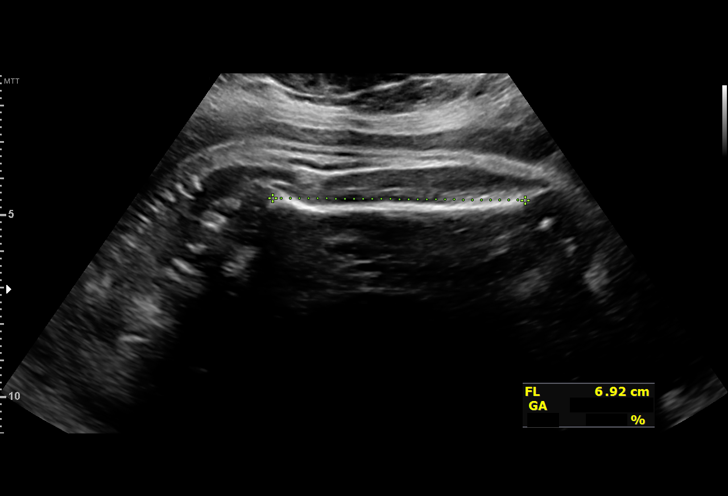
[im 30/45]
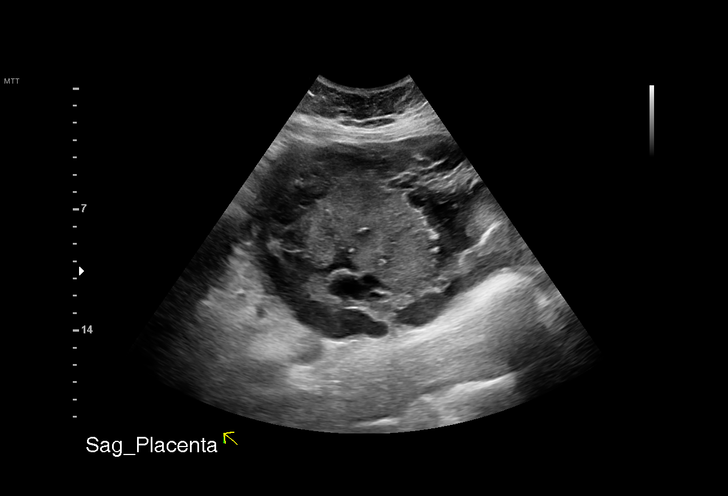
[im 33/45]
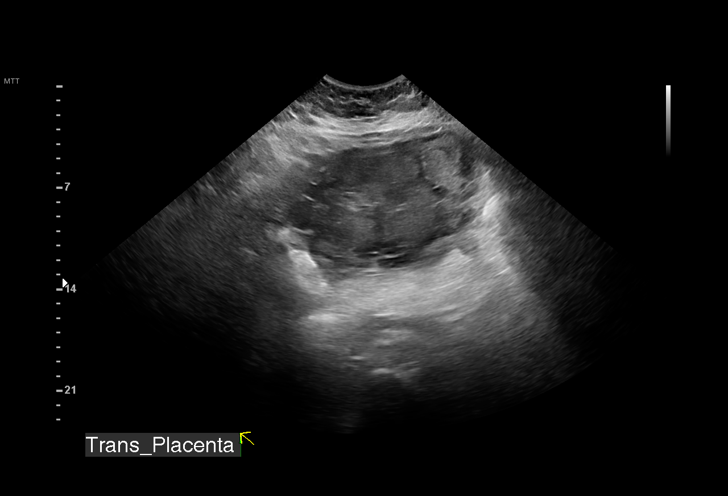
[im 36/45]
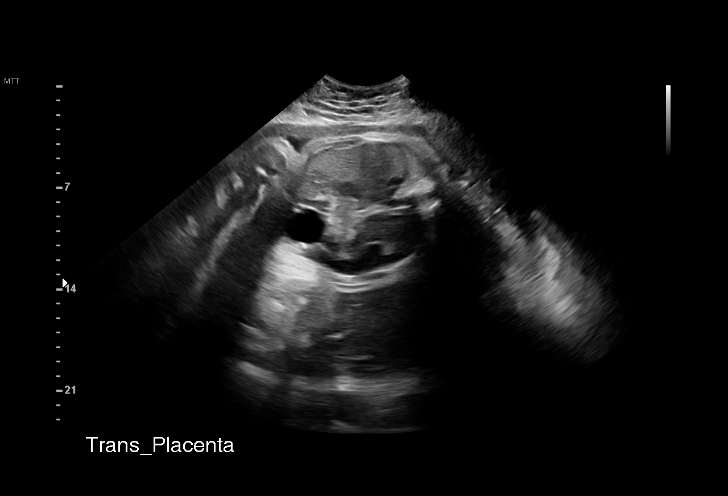
[im 40/45]
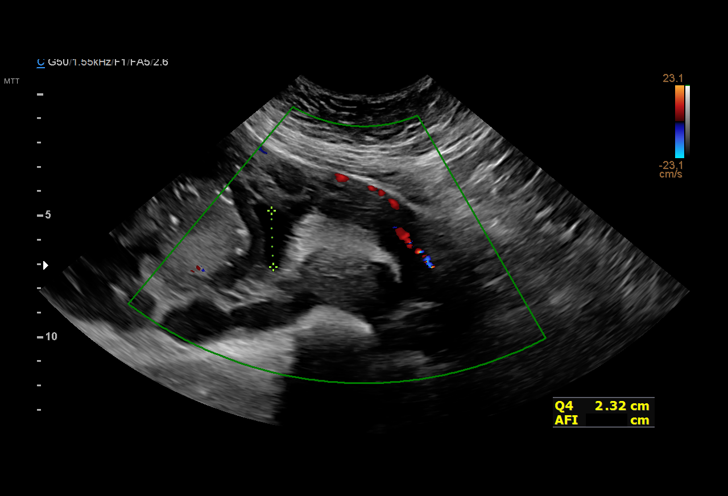
[im 43/45]
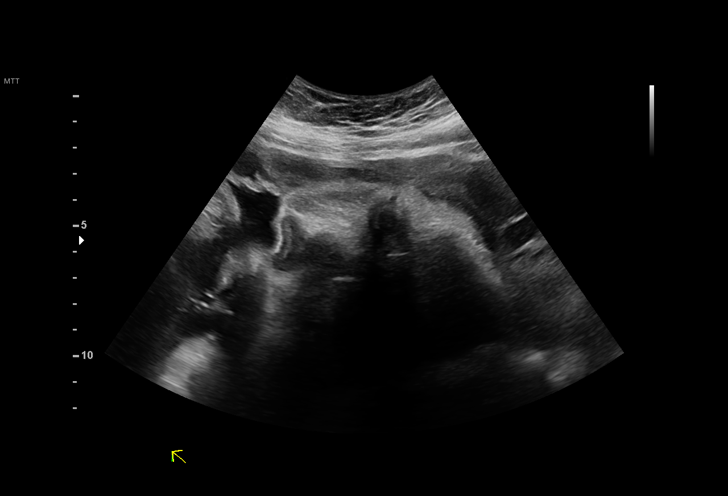

[13 of 28 positions shown; findings below may reference images not displayed]

ADMITA

 Attending:        Gupta Yadab        Secondary Phy.:   [REDACTED]
                                                            [REDACTED]-
                                                            Faculty Physician
                   Huynh NP                               [HOSPITAL] at
 Ref. Address:     [place] [REDACTED]
                   [id number] [HOSPITAL]

Indications

 36 weeks gestation of pregnancy
 Advanced maternal age multigravida 35+,
 third trimester
 Encounter for other antenatal screening
 follow-up
 Poor obstetric history: Prior fetal
 macrosomia (9 lb and 10 lb), antepartum
 Late to prenatal care, third trimester
 Insufficient Prenatal Care
Fetal Evaluation

 Num Of Fetuses:         1
 Fetal Heart Rate(bpm):  143
 Cardiac Activity:       Observed
 Presentation:           Cephalic
 Placenta:               Posterior
 P. Cord Insertion:      Previously Visualized
 Amniotic Fluid
 AFI FV:      Within normal limits

 AFI Sum(cm)     %Tile       Largest Pocket(cm)
 12.4            41

 RUQ(cm)       RLQ(cm)       LUQ(cm)        LLQ(cm)

Biometry

 BPD:      87.9  mm     G. Age:  35w 4d         27  %    CI:        75.17   %    70 - 86
                                                         FL/HC:      21.5   %    20.8 -
 HC:      321.6  mm     G. Age:  36w 2d         14  %    HC/AC:      0.87        0.92 -
 AC:       369   mm     G. Age:  40w 6d       > 99  %    FL/BPD:     78.7   %    71 - 87
 FL:       69.2  mm     G. Age:  35w 4d         17  %    FL/AC:      18.8   %    20 - 24

 LV:        2.3  mm

 Est. FW:    9880  gm    7 lb 12 oz      91  %
OB History

 Gravidity:    5         Term:   4
 Living:       4
Gestational Age

 LMP:           33w 4d        Date:  11/17/20                 EDD:   08/24/21
 U/S Today:     37w 1d                                        EDD:   07/30/21
 Best:          36w 6d     Det. By:  U/S  (03/30/21)          EDD:   08/01/21
Anatomy

 Cranium:               Appears normal         LVOT:                   Previously seen
 Cavum:                 Appears normal         Aortic Arch:            Previously seen
 Ventricles:            Appears normal         Ductal Arch:            Previously seen
 Choroid Plexus:        Previously seen        Diaphragm:              Appears normal
 Cerebellum:            Previously seen        Stomach:                Appears normal, left
                                                                       sided
 Posterior Fossa:       Previously seen        Abdomen:                Previously seen
 Nuchal Fold:           Not applicable (>20    Abdominal Wall:         Previously seen
                        wks GA)
 Face:                  Orbits and profile     Cord Vessels:           Previously seen
                        previously seen
 Lips:                  Previously seen        Kidneys:                Appear normal
 Palate:                Previously seen        Bladder:                Appears normal
 Thoracic:              Appears normal         Spine:                  Previously seen
 Heart:                 Appears normal         Upper Extremities:      Previously seen
                        (4CH, axis, and
                        situs)
 RVOT:                  Previously seen        Lower Extremities:      Previously seen

 Other:  Nasal bone previously visualized. Heels/feet and open hands/5th
         previously visualized. Female gender previously seen.  Technicallly
         difficult due to advanced GA and maternal habitus.
Cervix Uterus Adnexa
 Cervix
 Not visualized (advanced GA >22wks)

 Uterus
 No abnormality visualized.

 Right Ovary
 Within normal limits.

 Left Ovary
 Not visualized.

 Cul De Sac
 No free fluid seen.

 Adnexa
 No abnormality visualized.
Impression

 Patient returned for fetal growth assessment.  She does not
 have gestational diabetes.  Blood pressure today at her office
 is 108/70 mmHg.

 Fetal growth is appropriate for gestational age.  The
 estimated fetal weight is at the 91st percentile.  Amniotic fluid
 is normal and good fetal activity seen.  Cephalic presentation.

 I counseled the patient with help of  Spanish language
 interpreter present in the room.  Ultrasound has limitations in
 accurately estimating fetal weights.  Her previous children
 weighed 9 pounds and 10 pounds at birth and she had
 uncomplicated vaginal deliveries.
Recommendations

 Follow-up scans as clinically indicated.
                 Huynh

## 2024-05-18 ENCOUNTER — Ambulatory Visit: Payer: Self-pay | Admitting: Internal Medicine

## 2024-05-19 ENCOUNTER — Ambulatory Visit: Payer: Self-pay | Admitting: Internal Medicine

## 2024-09-01 ENCOUNTER — Ambulatory Visit: Payer: Self-pay | Admitting: Internal Medicine
